# Patient Record
Sex: Male | Born: 2000 | Race: White | Hispanic: No | Marital: Single | Smoking: Never smoker
Health system: Southern US, Community
[De-identification: ages and names within clinical notes are randomized; demographics above are authoritative.]

---
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
## 2019-09-29 ENCOUNTER — Ambulatory Visit: Payer: Federal, State, Local not specified - PPO | Attending: Internal Medicine

## 2019-09-29 DIAGNOSIS — Z23 Encounter for immunization: Secondary | ICD-10-CM

## 2019-09-29 NOTE — Progress Notes (Signed)
   Covid-19 Vaccination Clinic  Name:  Raymond Hill    MRN: [id number] DOB: 15-May-2001  09/29/2019  [name] was observed post Covid-19 immunization for 15 minutes without incident. He was provided with Vaccine Information Sheet and instruction to access the V-Safe system.   [name] was instructed to call 911 with any severe reactions post vaccine: Marland Kitchen Difficulty breathing  . Swelling of face and throat  . A fast heartbeat  . A bad rash all over body  . Dizziness and weakness   Immunizations Administered    Name Date Dose VIS Date Route   Pfizer COVID-19 Vaccine 09/29/2019  4:10 PM 0.3 mL 06/02/2019 Intramuscular   Manufacturer: [employer], [employer]   Lot: [id number]   NDC: [address]-3

## 2019-10-25 ENCOUNTER — Ambulatory Visit: Payer: Federal, State, Local not specified - PPO | Attending: Internal Medicine

## 2019-10-25 DIAGNOSIS — Z23 Encounter for immunization: Secondary | ICD-10-CM

## 2019-10-25 NOTE — Progress Notes (Signed)
   Covid-19 Vaccination Clinic  Name:  Mark Hassey    MRN: [id number] DOB: 03-Jul-2000  10/25/2019  Mr. Walstad was observed post Covid-19 immunization for 15 minutes without incident. He was provided with Vaccine Information Sheet and instruction to access the V-Safe system.   Mr. Schildt was instructed to call 911 with any severe reactions post vaccine: Marland Kitchen Difficulty breathing  . Swelling of face and throat  . A fast heartbeat  . A bad rash all over body  . Dizziness and weakness   Immunizations Administered    Name Date Dose VIS Date Route   Pfizer COVID-19 Vaccine 10/25/2019  9:46 AM 0.3 mL 08/16/2018 Intramuscular   Manufacturer: [employer], [employer]   Lot: [id number]   NDC: [address]-3

## 2022-09-03 ENCOUNTER — Other Ambulatory Visit: Payer: Self-pay

## 2022-09-03 ENCOUNTER — Ambulatory Visit
Admission: EM | Admit: 2022-09-03 | Discharge: 2022-09-03 | Disposition: A | Discharge status: Home / Self Care | Payer: Federal, State, Local not specified - PPO | Attending: Emergency Medicine | Admitting: Emergency Medicine

## 2022-09-03 DIAGNOSIS — J020 Streptococcal pharyngitis: Secondary | ICD-10-CM | POA: Diagnosis not present

## 2022-09-03 LAB — GROUP A STREP BY PCR: Group A Strep by PCR: DETECTED — AB

## 2022-09-03 MED ORDER — AMOXICILLIN-POT CLAVULANATE 875-125 MG PO TABS: 1.0000 | ORAL_TABLET | Freq: Two times a day (BID) | ORAL | 0 refills | Status: AC

## 2022-09-03 NOTE — ED Triage Notes (Signed)
Sore throat started on Tuesday. Initially felt sore on right, Wed. Pain was both sides and today pain isolated on left denies fever.

## 2022-09-03 NOTE — ED Provider Notes (Signed)
[facility]    CSN: [id number] Arrival date & time: 09/03/22  0803      History   Chief Complaint Chief Complaint  Patient presents with   Sore   Nasal Congestion    Sore throat and congestion since Tuesday    HPI Raymond Hill is a 22 y.o. male.   HPI  22 year old male here for evaluation of sore throat.  The patient has no significant past medical or surgical history and he presents for evaluation of sore throat that began 2 days ago.  He does endorse some associated nasal congestion but denies fever, runny nose, ear pain, or cough.  No known sick contacts.  History reviewed. No pertinent past medical history.  There are no problems to display for this patient.   History reviewed. No pertinent surgical history.     Home Medications    Prior to Admission medications   Medication Sig Start Date End Date Taking? Authorizing Provider  amoxicillin-clavulanate (AUGMENTIN) 875-125 MG tablet Take 1 tablet by mouth every 12 (twelve) hours for 10 days. 09/03/22 09/13/22 Yes Margarette Canada, NP    Family History History reviewed. No pertinent family history.  Social History Social History   Tobacco Use   Smoking status: Never   Smokeless tobacco: Never  Substance Use Topics   Alcohol use: No   Drug use: No     Allergies   Patient has no known allergies.   Review of Systems Review of Systems  Constitutional:  Negative for fever.  HENT:  Positive for congestion and sore throat. Negative for ear pain and rhinorrhea.   Respiratory:  Negative for cough.      Physical Exam Triage Vital Signs ED Triage Vitals  Enc Vitals Group     BP      Pulse      Resp      Temp      Temp src      SpO2      Weight      Height      Head Circumference      Peak Flow      Pain Score      Pain Loc      Pain Edu?      Excl. in [place]?    No data found.  Updated Vital Signs BP 133/64   Pulse 90   Temp 99.2 F (37.3 C)   Resp 20   SpO2 100%   Visual  Acuity Right Eye Distance:   Left Eye Distance:   Bilateral Distance:    Right Eye Near:   Left Eye Near:    Bilateral Near:     Physical Exam Vitals and nursing note reviewed.  Constitutional:      Appearance: Normal appearance. He is not ill-appearing.  HENT:     Head: Normocephalic and atraumatic.     Right Ear: Tympanic membrane, ear canal and external ear normal. There is no impacted cerumen.     Left Ear: Tympanic membrane, ear canal and external ear normal. There is no impacted cerumen.     Nose: Congestion and rhinorrhea present.     Comments: Nasal mucosa in both nasal passages is mildly edematous without significant erythema.  There is some dried yellow discharge in both nares to a scant degree.    Mouth/Throat:     Mouth: Mucous membranes are moist.     Pharynx: Oropharynx is clear. Posterior oropharyngeal erythema present. No oropharyngeal exudate.     Comments:  Soft palate and uvula are erythematous, as of the tonsils.  Tonsils also display 1+ edema but no exudate.  Posterior oropharynx has mild erythema with scant clear postnasal drip. Cardiovascular:     Rate and Rhythm: Normal rate.     Pulses: Normal pulses.     Heart sounds: Normal heart sounds. No murmur heard.    No friction rub. No gallop.  Pulmonary:     Effort: Pulmonary effort is normal.     Breath sounds: Normal breath sounds. No wheezing, rhonchi or rales.  Musculoskeletal:     Cervical back: Normal range of motion and neck supple.  Lymphadenopathy:     Cervical: No cervical adenopathy.  Skin:    General: Skin is warm and dry.     Capillary Refill: Capillary refill takes less than 2 seconds.     Findings: No rash.  Neurological:     General: No focal deficit present.     Mental Status: He is alert and oriented to person, place, and time.      UC Treatments / Results  Labs (all labs ordered are listed, but only abnormal results are displayed) Labs Reviewed  GROUP A STREP BY PCR - Abnormal;  Notable for the following components:      Result Value   Group A Strep by PCR DETECTED (*)    All other components within normal limits    EKG   Radiology No results found.  Procedures Procedures (including critical care time)  Medications Ordered in UC Medications - No data to display  Initial Impression / Assessment and Plan / UC Course  I have reviewed the triage vital signs and the nursing notes.  Pertinent labs & imaging results that were available during my care of the patient were reviewed by me and considered in my medical decision making (see chart for details).   Patient is a pleasant, nontoxic-appearing 22 year old male presenting for evaluation of sore throat with nasal congestion but no other associated upper or lower respiratory symptoms.  Patient denies fever at home but he does have a mildly elevated temp here in clinic of 99.2.  Remainder of vital signs are unremarkable.  His physical exam does reveal erythema to the soft palate and uvula as well as bilateral tonsils but there is not significant edema or exudate noted on exam.  He does have inflamed nasal mucosa with some dried yellow discharge to a very small degree in both nasal passages.  I will order a strep PCR.  I suspect that the patient's sore throat may also be aggravated by the postnasal drip present on exam.  Strep PCR is positive.  I will discharge patient home with diagnosis of strep pharyngitis and treated with Augmentin 875 twice daily for 10 days.  Supportive care reviewed.  Work note provided.   Final Clinical Impressions(s) / UC Diagnoses   Final diagnoses:  Strep pharyngitis     Discharge Instructions      Take the Augmentin twice daily for 10 days for treatment of your strep throat.  Gargle with warm salt water 2-3 times a day to soothe your throat, aid in pain relief, and aid in healing.  Take over-the-counter ibuprofen according to the package instructions as needed for pain.  You  can also use Chloraseptic or Sucrets lozenges, 1 lozenge every 2 hours as needed for throat pain.  If you develop any new or worsening symptoms return for reevaluation.      ED Prescriptions     Medication  Sig Dispense Auth. Provider   amoxicillin-clavulanate (AUGMENTIN) 875-125 MG tablet Take 1 tablet by mouth every 12 (twelve) hours for 10 days. 20 tablet Margarette Canada, NP      PDMP not reviewed this encounter.   Margarette Canada, NP 09/03/22 [phone]

## 2022-09-03 NOTE — Discharge Instructions (Signed)
Take the Augmentin twice daily for 10 days for treatment of your strep throat.  Gargle with warm salt water 2-3 times a day to soothe your throat, aid in pain relief, and aid in healing.  Take over-the-counter ibuprofen according to the package instructions as needed for pain.  You can also use Chloraseptic or Sucrets lozenges, 1 lozenge every 2 hours as needed for throat pain.  If you develop any new or worsening symptoms return for reevaluation.  

## 2022-09-22 ENCOUNTER — Ambulatory Visit
Admission: EM | Admit: 2022-09-22 | Discharge: 2022-09-22 | Disposition: A | Discharge status: Home / Self Care | Payer: Federal, State, Local not specified - PPO | Attending: Emergency Medicine | Admitting: Emergency Medicine

## 2022-09-22 ENCOUNTER — Encounter: Payer: Self-pay | Admitting: Emergency Medicine

## 2022-09-22 DIAGNOSIS — J020 Streptococcal pharyngitis: Secondary | ICD-10-CM | POA: Diagnosis not present

## 2022-09-22 LAB — GROUP A STREP BY PCR: Group A Strep by PCR: DETECTED — AB

## 2022-09-22 MED ORDER — AMOXICILLIN 500 MG PO CAPS: 500.0000 mg | ORAL_CAPSULE | Freq: Two times a day (BID) | ORAL | 0 refills | Status: AC

## 2022-09-22 NOTE — Discharge Instructions (Addendum)
Amoxicillin: 1 tablet twice a day for 10 days -Ibuprofen or Tylenol as needed for pain -Warm salt water gargles can help with pain as well -Follow-up with primary care as needed

## 2022-09-22 NOTE — ED Provider Notes (Addendum)
[facility]    CSN: [id number] Arrival date & time: 09/22/22  1858      History   Chief Complaint Chief Complaint  Patient presents with   Nasal Congestion   Fever   Sore Throat   Generalized Body Aches   Cough    HPI Raymond Hill is a 22 y.o. male.   Patient is a 22 year old male who presents with chief complaint of sore throat, congestion, body aches, and fever that started yesterday.  He states he developed a cough today.  Patient no other symptoms states has been using ibuprofen for his symptoms.    Patient was seen here on March 14 with a positive strep PCR and given prescription for Augmentin.    History reviewed. No pertinent past medical history.  There are no problems to display for this patient.   History reviewed. No pertinent surgical history.     Home Medications    Prior to Admission medications   Medication Sig Start Date End Date Taking? Authorizing Provider  amoxicillin (AMOXIL) 500 MG capsule Take 1 capsule (500 mg total) by mouth 2 (two) times daily for 10 days. 09/22/22 10/02/22 Yes Luvenia Redden, PA-C    Family History No family history on file.  Social History Social History   Tobacco Use   Smoking status: Never   Smokeless tobacco: Never  Vaping Use   Vaping Use: Never used  Substance Use Topics   Alcohol use: Yes    Comment: social   Drug use: No     Allergies   Patient has no known allergies.   Review of Systems Review of Systems as above in HPI.  Other systems reviewed and found to be negative   Physical Exam Triage Vital Signs ED Triage Vitals  Enc Vitals Group     BP 09/22/22 1913 133/70     Pulse Rate 09/22/22 1913 (!) 107     Resp 09/22/22 1913 16     Temp 09/22/22 1913 99.8 F (37.7 C)     Temp Source 09/22/22 1913 Oral     SpO2 09/22/22 1913 98 %     Weight --      Height --      Head Circumference --      Peak Flow --      Pain Score 09/22/22 1911 3     Pain Loc --      Pain Edu?  --      Excl. in [place]? --    No data found.  Updated Vital Signs BP 133/70 (BP Location: Left Arm)   Pulse (!) 107   Temp 99.8 F (37.7 C) (Oral)   Resp 16   SpO2 98%   Visual Acuity Right Eye Distance:   Left Eye Distance:   Bilateral Distance:    Right Eye Near:   Left Eye Near:    Bilateral Near:     Physical Exam HENT:     Head: Normocephalic and atraumatic.     Right Ear: Tympanic membrane normal.     Left Ear: Tympanic membrane normal.     Mouth/Throat:     Mouth: Mucous membranes are moist.     Pharynx: Posterior oropharyngeal erythema present.     Tonsils: 1+ on the right. 1+ on the left.  Cardiovascular:     Rate and Rhythm: Normal rate.  Pulmonary:     Effort: Pulmonary effort is normal. No respiratory distress.  Neurological:     General: No focal  deficit present.     Mental Status: He is oriented to person, place, and time.  Psychiatric:        Mood and Affect: Mood normal.        Behavior: Behavior normal.      UC Treatments / Results  Labs (all labs ordered are listed, but only abnormal results are displayed) Labs Reviewed  GROUP A STREP BY PCR - Abnormal; Notable for the following components:      Result Value   Group A Strep by PCR DETECTED (*)    All other components within normal limits    EKG   Radiology No results found.  Procedures Procedures (including critical care time)  Medications Ordered in UC Medications - No data to display  Initial Impression / Assessment and Plan / UC Course  I have reviewed the triage vital signs and the nursing notes.  Pertinent labs & imaging results that were available during my care of the patient were reviewed by me and considered in my medical decision making (see chart for details).     Patient presents with sore throat, congestion, body aches, and fever that started yesterday with cough starting today.  Group A strep PCR was positive.  Given prescription for antibiotic.-Follow-up primary  care if no improvement Final Clinical Impressions(s) / UC Diagnoses   Final diagnoses:  Strep pharyngitis     Discharge Instructions      Amoxicillin: 1 tablet twice a day for 10 days -Ibuprofen or Tylenol as needed for pain -Warm salt water gargles can help with pain as well -Follow-up with primary care as needed     ED Prescriptions     Medication Sig Dispense Auth. Provider   amoxicillin (AMOXIL) 500 MG capsule Take 1 capsule (500 mg total) by mouth 2 (two) times daily for 10 days. 20 capsule Luvenia Redden, PA-C      PDMP not reviewed this encounter.   Luvenia Redden, PA-C 09/22/22 1954    Luvenia Redden, PA-C 09/22/22 1955

## 2022-09-22 NOTE — ED Triage Notes (Signed)
Pt c/o sore throat, congestion, bodyaches and fever that started yesterday. He developed a cough today.

## 2022-11-29 ENCOUNTER — Ambulatory Visit
Admission: EM | Admit: 2022-11-29 | Discharge: 2022-11-29 | Disposition: A | Discharge status: Home / Self Care | Payer: Federal, State, Local not specified - PPO | Attending: Physician Assistant | Admitting: Physician Assistant

## 2022-11-29 DIAGNOSIS — L03113 Cellulitis of right upper limb: Secondary | ICD-10-CM | POA: Diagnosis not present

## 2022-11-29 MED ORDER — CEPHALEXIN 500 MG PO CAPS: 500.0000 mg | ORAL_CAPSULE | Freq: Four times a day (QID) | ORAL | 0 refills | Status: AC

## 2022-11-29 MED ORDER — CEFTRIAXONE SODIUM 1 G IJ SOLR
1.0000 g | Freq: Once | INTRAMUSCULAR | Status: AC
  Administered 2022-11-29: 1 g via INTRAMUSCULAR

## 2022-11-29 NOTE — ED Provider Notes (Signed)
[facility]    CSN: [id number] Arrival date & time: 11/29/22  1352      History   Chief Complaint Chief Complaint  Patient presents with   Insect Bite    HPI Raymond Hill is a 22 y.o. male presenting for right arm redness, swelling and itching that he noticed a few days ago.  He says it has gotten a lot worse over the past 24 hours.  Reports that initially just like an insect bite but he noticed a red streak up his arm yesterday which worsened today.  He has not treated condition in any way.  He denies any associated pain or problems moving his wrist.  Denies fever.  No history of similar symptoms.  Never saw an insect bite or sting.  No history of MRSA.  No other complaints.  HPI  History reviewed. No pertinent past medical history.  There are no problems to display for this patient.   History reviewed. No pertinent surgical history.     Home Medications    Prior to Admission medications   Medication Sig Start Date End Date Taking? Authorizing Provider  cephALEXin (KEFLEX) 500 MG capsule Take 1 capsule (500 mg total) by mouth 4 (four) times daily for 7 days. 11/29/22 12/06/22 Yes Shirlee Latch, PA-C    Family History History reviewed. No pertinent family history.  Social History Social History   Tobacco Use   Smoking status: Never   Smokeless tobacco: Never  Vaping Use   Vaping Use: Never used  Substance Use Topics   Alcohol use: Yes    Comment: social   Drug use: No     Allergies   Patient has no known allergies.   Review of Systems Review of Systems  Constitutional:  Negative for fatigue and fever.  Musculoskeletal:  Negative for arthralgias and joint swelling.  Skin:  Positive for color change and rash.     Physical Exam Triage Vital Signs ED Triage Vitals  Enc Vitals Group     BP      Pulse      Resp      Temp      Temp src      SpO2      Weight      Height      Head Circumference      Peak Flow      Pain Score       Pain Loc      Pain Edu?      Excl. in [place]?    No data found.  Updated Vital Signs BP 133/79 (BP Location: Left Arm)   Pulse 78   Temp 98.3 F (36.8 C)   Resp 19   Wt 195 lb (88.5 kg)   SpO2 96%      Physical Exam Vitals and nursing note reviewed.  Constitutional:      General: He is not in acute distress.    Appearance: Normal appearance. He is well-developed. He is not ill-appearing.  HENT:     Head: Normocephalic and atraumatic.  Eyes:     General: No scleral icterus.    Conjunctiva/sclera: Conjunctivae normal.  Cardiovascular:     Rate and Rhythm: Normal rate and regular rhythm.     Heart sounds: Normal heart sounds.  Pulmonary:     Effort: Pulmonary effort is normal. No respiratory distress.     Breath sounds: Normal breath sounds.  Musculoskeletal:     Cervical back: Neck supple.  Skin:  General: Skin is warm and dry.     Capillary Refill: Capillary refill takes less than 2 seconds.     Findings: Rash present.     Comments: Right forearm: See image below.  There is a small pinpoint black puncture of the ventral wrist with surrounding area of erythema, induration, increased warmth with a red streak up the ventral forearm.  Area is nontender.  Full range of motion of wrist without pain.  Neurological:     General: No focal deficit present.     Mental Status: He is alert. Mental status is at baseline.     Motor: No weakness.     Gait: Gait normal.  Psychiatric:        Mood and Affect: Mood normal.        Behavior: Behavior normal.      UC Treatments / Results  Labs (all labs ordered are listed, but only abnormal results are displayed) Labs Reviewed - No data to display  EKG   Radiology No results found.  Procedures Procedures (including critical care time)  Medications Ordered in UC Medications  cefTRIAXone (ROCEPHIN) injection 1 g (has no administration in time range)    Initial Impression / Assessment and Plan / UC Course  I have reviewed  the triage vital signs and the nursing notes.  Pertinent labs & imaging results that were available during my care of the patient were reviewed by me and considered in my medical decision making (see chart for details).   22 year old male presents for right arm redness, swelling, itching that he noticed a few days ago.  Recent worsening of symptoms over the past 24 hours.  No treatment so far.  No fever, range of motion problems with his wrist.  No history of MRSA.  Image included in chart appears consistent with cellulitis/lymphangitis.  Will treat at this time with antibiotics.  Patient given 1 g IM Rocephin in clinic and advised to start Keflex tomorrow.  Reviewed cool compresses.  Reviewed outline the area with a marker.  Explained the redness should be shrinking and symptoms improving over the next 2 to 3 days but if you develop fever, worsening symptoms, spread of rash that he should go to the emergency department.  Patient is understanding and agreeable.   Final Clinical Impressions(s) / UC Diagnoses   Final diagnoses:  Cellulitis of right arm     Discharge Instructions      -You have an infection.  We gave you a injection of antibiotic.  Start the oral antibiotics tomorrow. - Ice and elevate your extremity.  Draw a line around the area of redness.  The redness should be shrinking over the next 2 days.  If it spreads or you develop pain, difficulty moving wrist, fever you should go to the emergency department.     ED Prescriptions     Medication Sig Dispense Auth. Provider   cephALEXin (KEFLEX) 500 MG capsule Take 1 capsule (500 mg total) by mouth 4 (four) times daily for 7 days. 28 capsule Shirlee Latch, PA-C      PDMP not reviewed this encounter.   Shirlee Latch, PA-C 11/29/22 1431

## 2022-11-29 NOTE — ED Triage Notes (Signed)
Pt states that he got a bite a few days ago. swelling started yesterday and redness this morning. No meds.

## 2022-11-29 NOTE — Discharge Instructions (Signed)
-  You have an infection.  We gave you a injection of antibiotic.  Start the oral antibiotics tomorrow. - Ice and elevate your extremity.  Draw a line around the area of redness.  The redness should be shrinking over the next 2 days.  If it spreads or you develop pain, difficulty moving wrist, fever you should go to the emergency department.

## 2022-11-29 NOTE — ED Triage Notes (Signed)
Right arm
